# Patient Record
Sex: Male | Born: 1980
Health system: Southern US, Community
[De-identification: ages and names within clinical notes are randomized; demographics above are authoritative.]

---

## 2017-01-29 ENCOUNTER — Emergency Department (HOSPITAL_COMMUNITY)
Admission: EM | Admit: 2017-01-29 | Discharge: 2017-01-29 | Disposition: A | Discharge status: Home / Self Care | Payer: Managed Care, Other (non HMO) | Attending: Emergency Medicine | Admitting: Emergency Medicine

## 2017-01-29 DIAGNOSIS — R45851 Suicidal ideations: Secondary | ICD-10-CM | POA: Diagnosis not present

## 2017-01-29 DIAGNOSIS — Z79899 Other long term (current) drug therapy: Secondary | ICD-10-CM | POA: Insufficient documentation

## 2017-01-29 DIAGNOSIS — F4325 Adjustment disorder with mixed disturbance of emotions and conduct: Secondary | ICD-10-CM | POA: Diagnosis not present

## 2017-01-29 LAB — CBC WITH DIFFERENTIAL/PLATELET
BASOS ABS: 0 10*3/uL (ref 0.0–0.1)
Basophils Relative: 1 %
EOS PCT: 2 %
Eosinophils Absolute: 0.1 10*3/uL (ref 0.0–0.7)
HEMATOCRIT: 41.3 % (ref 39.0–52.0)
HEMOGLOBIN: 14.5 g/dL (ref 13.0–17.0)
LYMPHS ABS: 2 10*3/uL (ref 0.7–4.0)
LYMPHS PCT: 31 %
MCH: 32 pg (ref 26.0–34.0)
MCHC: 35.1 g/dL (ref 30.0–36.0)
MCV: 91.2 fL (ref 78.0–100.0)
Monocytes Absolute: 0.5 10*3/uL (ref 0.1–1.0)
Monocytes Relative: 8 %
NEUTROS ABS: 3.8 10*3/uL (ref 1.7–7.7)
NEUTROS PCT: 58 %
Platelets: 245 10*3/uL (ref 150–400)
RBC: 4.53 MIL/uL (ref 4.22–5.81)
RDW: 12.7 % (ref 11.5–15.5)
WBC: 6.5 10*3/uL (ref 4.0–10.5)

## 2017-01-29 LAB — COMPREHENSIVE METABOLIC PANEL
ALK PHOS: 53 U/L (ref 38–126)
ALT: 32 U/L (ref 17–63)
AST: 30 U/L (ref 15–41)
Albumin: 4.2 g/dL (ref 3.5–5.0)
Anion gap: 7 (ref 5–15)
BILIRUBIN TOTAL: 0.2 mg/dL — AB (ref 0.3–1.2)
BUN: 9 mg/dL (ref 6–20)
CALCIUM: 8.8 mg/dL — AB (ref 8.9–10.3)
CO2: 28 mmol/L (ref 22–32)
Chloride: 105 mmol/L (ref 101–111)
Creatinine, Ser: 1.05 mg/dL (ref 0.61–1.24)
GFR calc Af Amer: >60 mL/min (ref >60)
Glucose, Bld: 91 mg/dL (ref 65–99)
Potassium: 3.6 mmol/L (ref 3.5–5.1)
SODIUM: 140 mmol/L (ref 135–145)
TOTAL PROTEIN: 7 g/dL (ref 6.5–8.1)

## 2017-01-29 LAB — RAPID URINE DRUG SCREEN, HOSP PERFORMED
Amphetamines: NOT DETECTED
BARBITURATES: NOT DETECTED
BENZODIAZEPINES: NOT DETECTED
Cocaine: NOT DETECTED
Opiates: NOT DETECTED
Tetrahydrocannabinol: NOT DETECTED

## 2017-01-29 LAB — ETHANOL: ALCOHOL ETHYL (B): 127 mg/dL — AB (ref <5)

## 2017-01-29 MED ORDER — ONDANSETRON HCL 4 MG PO TABS: 4.0000 mg | ORAL_TABLET | Freq: Three times a day (TID) | ORAL | Status: DC | PRN

## 2017-01-29 MED ORDER — IBUPROFEN 200 MG PO TABS: 600.0000 mg | ORAL_TABLET | Freq: Three times a day (TID) | ORAL | Status: DC | PRN

## 2017-01-29 MED ORDER — ALUM & MAG HYDROXIDE-SIMETH 200-200-20 MG/5ML PO SUSP: 30.0000 mL | Freq: Four times a day (QID) | ORAL | Status: DC | PRN

## 2017-01-29 NOTE — ED Triage Notes (Signed)
Pt IVC by wife after pt made statements of si

## 2017-01-29 NOTE — ED Notes (Signed)
Pt. Transferred to [facility] from ED to room 37 after screening for contraband. Report to include Situation, Background, Assessment and Recommendations from [facility]. Pt. Oriented to unit including Q15 minute rounds as well as the security cameras for their protection. Patient is alert and oriented, warm and dry in no acute distress. Patient denies SI, HI, and AVH. Pt. Encouraged to let me know if needs arise.

## 2017-01-29 NOTE — Progress Notes (Signed)
TTS went to assess pt. Pt currently being transferred from [facility] to [facility]. Assessment to be completed once transfer is complete.   Princess Bruins, MSW, LCSWA TTS Specialist [phone]

## 2017-01-29 NOTE — ED Notes (Signed)
IVC has been rescinded per DNP

## 2017-01-29 NOTE — ED Notes (Addendum)
Written dc instructions reviewed with pt.  Pt denies si/hi/avh on dc.  Pt encouraged to seek treatment if suicidal/homicidal thoughts/urges reoccur.  Pt verbalized understanding.  Pt ambulatory with mHt to dc ara, belongings returned after leaving the area.

## 2017-01-29 NOTE — ED Notes (Signed)
Up to the bathroom 

## 2017-01-29 NOTE — ED Notes (Signed)
Hourly rounding reveals patient in room talking to TTS. No complaints, stable, in no acute distress. Q15 minute rounds and monitoring via Security Cameras to continue. 

## 2017-01-29 NOTE — [facility] Suicide Risk Assessment (Signed)
Suicide Risk Assessment  Discharge Assessment   [facility] Discharge Suicide Risk Assessment   Principal Problem: Adjustment disorder with mixed disturbance of emotions and conduct Discharge Diagnoses:  Patient Active Problem List   Diagnosis Date Noted  . Adjustment disorder with mixed disturbance of emotions and conduct [F43.25] 01/29/2017    Priority: High    Total Time spent with patient: 45 minutes  Musculoskeletal: Strength & Muscle Tone: within normal limits Gait & Station: normal Patient leans: N/A  Psychiatric Specialty Exam:   Blood pressure (!) 144/89, pulse (!) 104, temperature 98.4 F (36.9 C), temperature source Oral, resp. rate 18, SpO2 96 %.There is no height or weight on file to calculate BMI.  General Appearance: Casual  Eye Contact::  Good  Speech:  Normal Rate409  Volume:  Normal  Mood:  Euthymic  Affect:  Congruent  Thought Process:  Coherent and Descriptions of Associations: Intact  Orientation:  Full (Time, Place, and Person)  Thought Content:  WDL and Logical  Suicidal Thoughts:  No  Homicidal Thoughts:  No  Memory:  Immediate;   Good Recent;   Good Remote;   Good  Judgement:  Fair  Insight:  Fair  Psychomotor Activity:  Normal  Concentration:  Good  Recall:  Good  Fund of Knowledge:Good  Language: Good  Akathisia:  No  Handed:  Right  AIMS (if indicated):     Assets:  Housing Intimacy Leisure Time Physical Health Resilience Social Support  Sleep:     Cognition: WNL  ADL's:  Intact   Mental Status Per Nursing Assessment::   On Admission:   36 yo male who came to the ED after drinking and arguing with his wife.  Not suicidal/homicidal ideations, hallucinations, and withdrawal symptoms.  Lives with his wife and she has removed the guns from the home, called by TTS.  Stable for discharge.    Demographic Factors:  Male and Caucasian  Loss Factors: NA  Historical Factors: NA  Risk Reduction Factors:   Sense of responsibility to  family, Living with another person, especially a relative and Positive social support  Continued Clinical Symptoms:  None   Cognitive Features That Contribute To Risk:  None    Suicide Risk:  Minimal: No identifiable suicidal ideation.  Patients presenting with no risk factors but with morbid ruminations; may be classified as minimal risk based on the severity of the depressive symptoms    Plan Of Care/Follow-up recommendations:  Activity:  as tolerated Diet:  heart healthy diet  Keith Felten, NP 01/29/2017, 12:29 PM

## 2017-01-29 NOTE — ED Notes (Signed)
Registration in w/pt. 

## 2017-01-29 NOTE — ED Notes (Signed)
Pt changed to paper scrubbs and wanded by security

## 2017-01-29 NOTE — ED Provider Notes (Signed)
[facility]EMER[facility]ENC[facility] DEPT Provider Note   CSN: [id number] Arrival date & time: 01/29/17  0214     History   Chief Complaint Chief Complaint  Patient presents with  . Medical Clearance    HPI Ricky Grant is a 36 y.o. male.  The history is provided by the patient, the police and medical records.  Mental Health Problem  Presenting symptoms: suicidal thoughts and suicidal threats   Presenting symptoms: no hallucinations   Patient accompanied by:  Law enforcement Degree of incapacity (severity):  Severe Onset quality:  Unable to specify Timing:  Constant Progression:  Unchanged Chronicity:  New Context: alcohol use   Treatment compliance:  Most of the time Relieved by:  Nothing Worsened by:  Nothing Ineffective treatments:  None tried Associated symptoms: no feelings of worthlessness   Risk factors: no family hx of mental illness     No past medical history on file.  There are no active problems to display for this patient.   No past surgical history on file.     Home Medications    Prior to Admission medications   Medication Sig Start Date End Date Taking? Authorizing Provider  busPIRone (BUSPAR) 15 MG tablet Take 7.5 mg by mouth daily.   Yes [provider]    Family History No family history on file.  Social History Social History  Substance Use Topics  . Smoking status: Not on file  . Smokeless tobacco: Not on file  . Alcohol use Not on file     Allergies   Patient has no known allergies.   Review of Systems Review of Systems  Gastrointestinal: Negative for diarrhea, nausea, rectal pain and vomiting.  Psychiatric/Behavioral: Positive for suicidal ideas. Negative for hallucinations.  All other systems reviewed and are negative.    Physical Exam Updated Vital Signs BP 117/85 (BP Location: Left Arm)   Pulse (!) 106   Temp 98.4 F (36.9 C) (Oral)   Resp 18   SpO2 98%   Physical Exam  Constitutional: He is oriented to person,  place, and time. He appears well-developed and well-nourished. No distress.  HENT:  Head: Normocephalic and atraumatic.  Nose: Nose normal.  Mouth/Throat: No oropharyngeal exudate.  Eyes: Pupils are equal, round, and reactive to light. Conjunctivae are normal.  Neck: Normal range of motion. Neck supple.  Cardiovascular: Normal rate, regular rhythm, normal heart sounds and intact distal pulses.   Pulmonary/Chest: Effort normal and breath sounds normal. He has no wheezes. He has no rales.  Abdominal: Soft. Bowel sounds are normal. He exhibits no mass. There is no tenderness. There is no rebound and no guarding.  Musculoskeletal: Normal range of motion.  Neurological: He is alert and oriented to person, place, and time.  Skin: Skin is warm and dry. Capillary refill takes less than 2 seconds.  Psychiatric: Thought content is not paranoid.     ED Treatments / Results   Vitals:   01/29/17 0231 01/29/17 0528  BP: 117/85 (!) 144/89  Pulse: (!) 106 (!) 104  Resp: 18 18  Temp: 98.4 F (36.9 C)   SpO2: 98% 96%    Labs (all labs ordered are listed, but only abnormal results are displayed) Results for orders placed or performed during the hospital encounter of 01/29/17  Comprehensive metabolic panel  Result Value Ref Range   Sodium 140 135 - 145 mmol/L   Potassium 3.6 3.5 - 5.1 mmol/L   Chloride 105 101 - 111 mmol/L   CO2 28 22 - 32 mmol/L  Glucose, Bld 91 65 - 99 mg/dL   BUN 9 6 - 20 mg/dL   Creatinine, Ser 2.87 0.61 - 1.24 mg/dL   Calcium 8.8 (L) 8.9 - 10.3 mg/dL   Total Protein 7.0 6.5 - 8.1 g/dL   Albumin 4.2 3.5 - 5.0 g/dL   AST 30 15 - 41 U/L   ALT 32 17 - 63 U/L   Alkaline Phosphatase 53 38 - 126 U/L   Total Bilirubin 0.2 (L) 0.3 - 1.2 mg/dL   GFR calc non Af Amer >60 >60 mL/min   GFR calc Af Amer >60 >60 mL/min   Anion gap 7 5 - 15  Ethanol  Result Value Ref Range   Alcohol, Ethyl (B) 127 (H) <5 mg/dL  Urine rapid drug screen (hosp performed)  Result Value Ref Range    Opiates NONE DETECTED NONE DETECTED   Cocaine NONE DETECTED NONE DETECTED   Benzodiazepines NONE DETECTED NONE DETECTED   Amphetamines NONE DETECTED NONE DETECTED   Tetrahydrocannabinol NONE DETECTED NONE DETECTED   Barbiturates NONE DETECTED NONE DETECTED  CBC with Diff  Result Value Ref Range   WBC 6.5 4.0 - 10.5 K/uL   RBC 4.53 4.22 - 5.81 MIL/uL   Hemoglobin 14.5 13.0 - 17.0 g/dL   HCT 86.7 67.2 - 09.4 %   MCV 91.2 78.0 - 100.0 fL   MCH 32.0 26.0 - 34.0 pg   MCHC 35.1 30.0 - 36.0 g/dL   RDW 70.9 62.8 - 36.6 %   Platelets 245 150 - 400 K/uL   Neutrophils Relative % 58 %   Neutro Abs 3.8 1.7 - 7.7 K/uL   Lymphocytes Relative 31 %   Lymphs Abs 2.0 0.7 - 4.0 K/uL   Monocytes Relative 8 %   Monocytes Absolute 0.5 0.1 - 1.0 K/uL   Eosinophils Relative 2 %   Eosinophils Absolute 0.1 0.0 - 0.7 K/uL   Basophils Relative 1 %   Basophils Absolute 0.0 0.0 - 0.1 K/uL   No results found.   Radiology No results found.  Procedures Procedures (including critical care time)  Medications Ordered in ED  Medications  ondansetron (ZOFRAN) tablet 4 mg (not administered)  alum & mag hydroxide-simeth (MAALOX/MYLANTA) 200-200-20 MG/5ML suspension 30 mL (not administered)  ibuprofen (ADVIL,MOTRIN) tablet 600 mg (not administered)       Final Clinical Impressions(s) / ED Diagnoses  Under IVC by family for making suicidal statements involving a gun.  He is medically cleared by be to be seen by psychiatry.       Milaina Sher, MD 01/29/17 [phone]

## 2017-01-29 NOTE — BH Assessment (Signed)
Tele Assessment Note   Patient Name: Ricky Grant MRN: [id number] Referring Physician: Cy Blamer, MD Location of Patient: [facility] Location of Provider: Behavioral Health TTS Department  Ricky Grant is an 36 y.o. male who presents to the ED under IVC initiated by the responding officer. IVC states the pt has a hx of Bipolar D/O and takes medication for his diagnosis. Pt denies this during the assessment. IVC reports the pt consumed a large amount of alcohol and has been drinking heavily. Pt reportedly told his wife his wanted to kill himself and LEO witnessed the pt attempted to access the guns out of his safe and also attempted to flee officers.  Pt provided verbal consent to contact the responding officer who initiated the IVC in order to obtain collateral information. TTS attempted to contact the responding officer at the number identified on the IVC [phone] but the number stated it is not a working number.   During the assessment, the pt reports that he got into an argument with his wife this evening and "said some things he should not have said." Pt reports that he is not suicidal and denies that he has ever wanted to harm himself or anyone else. Pt states he has 2 children at home that he loves and he has a lot to live for. Pt reports he has been dealing with stress at work and wanted to drink on Friday night. Pt reports he told his wife he should "just end it all" during the argument which led her to calling the police. Pt denies any previous inpt hospitalizations and he appeared anxious as this Clinical research associate explained inpt treatment to the pt. Pt stated "no I don't want that. I have to go to work. Can I leave? Am I legally allowed to leave the hospital? I don't want to be in a hospital. I don't want to kill myself. I would never do that."  Pt denies HI and denies AVH.   Case discussed with Nira Conn, NP who recommends inpt treatment. Pt's RN Jillyn Hidden notified of the recommendation. TTS  to seek placement.   Diagnosis: MDD, single episode, w/o psychosis; Alcohol Use D/O  Past Medical History: No past medical history on file.  No past surgical history on file.  Family History: No family history on file.  Social History:  has no tobacco, alcohol, and drug history on file.  Additional Social History:  Alcohol / Drug Use Pain Medications: See MAR Prescriptions: See MAR Over the Counter: See MAR History of alcohol / drug use?: Yes Substance #1 Name of Substance 1: Alcohol 1 - Age of First Use: teens 1 - Amount (size/oz): 5 beers 1 - Frequency: 2x/week 1 - Duration: ongoing 1 - Last Use / Amount: 01/29/17  CIWA: CIWA-Ar BP: (!) 144/89 Pulse Rate: (!) 104 COWS:    PATIENT STRENGTHS: (choose at least two) Average or above average intelligence Capable of independent living Communication skills General fund of knowledge Physical Health Work skills  Allergies: No Known Allergies  Home Medications:  (Not in a hospital admission)  OB/GYN Status:  No LMP for male patient.  General Assessment Data Location of Assessment: [facility] TTS Assessment: In system Is this a Tele or Face-to-Face Assessment?: Face-to-Face Is this an Initial Assessment or a Re-assessment for this encounter?: Initial Assessment Marital status: Married Is patient pregnant?: No Pregnancy Status: No Living Arrangements: Spouse/significant other, Children Can pt return to current living arrangement?: Yes Admission Status: Involuntary Is patient capable of signing voluntary admission?:  No Referral Source: Self/Family/Friend Insurance type: none     Crisis Care Plan Living Arrangements: Spouse/significant other, Children Name of Psychiatrist: none Name of Therapist: none  Education Status Is patient currently in school?: No Highest grade of school patient has completed: Bachelor's Degree  Risk to self with the past 6 months Suicidal Ideation: Yes-Currently Present (per IVC, pt  made threats to shoot himself. pt denies) Has patient been a risk to self within the past 6 months prior to admission? : Yes (per IVC, attempted to flee from officers) Suicidal Intent: Yes-Currently Present (per IVC) Has patient had any suicidal intent within the past 6 months prior to admission? : Yes Is patient at risk for suicide?: Yes Suicidal Plan?: Yes-Currently Present Has patient had any suicidal plan within the past 6 months prior to admission? : Yes Specify Current Suicidal Plan: per IVC, pt told officers he wanted to shoot himself with his guns  Access to Means: Yes Specify Access to Suicidal Means: pt reports he owns "a couple of guns" What has been your use of drugs/alcohol within the last 12 months?: reports to drinking heavy amounts of alcohol PTA Previous Attempts/Gestures: No Triggers for Past Attempts: None known Intentional Self Injurious Behavior: None Family Suicide History: No Recent stressful life event(s): Conflict (Comment), Other (Comment) (w/ wife, work stressors ) Persecutory voices/beliefs?: No Depression: Yes Depression Symptoms: Feeling angry/irritable, Guilt Substance abuse history and/or treatment for substance abuse?: Yes Suicide prevention information given to non-admitted patients: Not applicable  Risk to Others within the past 6 months Homicidal Ideation: No Does patient have any lifetime risk of violence toward others beyond the six months prior to admission? : No Thoughts of Harm to Others: No Current Homicidal Intent: No Current Homicidal Plan: No Access to Homicidal Means: No History of harm to others?: No Assessment of Violence: None Noted Does patient have access to weapons?: Yes (Comment) (pt reports he owns multiple guns ) Criminal Charges Pending?: No Does patient have a court date: No Is patient on probation?: No  Psychosis Hallucinations: None noted Delusions: None noted  Mental Status Report Appearance/Hygiene: In scrubs,  Unremarkable Eye Contact: Good Motor Activity: Freedom of movement Speech: Logical/coherent Level of Consciousness: Alert Mood: Pleasant, Anxious, Ashamed/humiliated, Depressed Affect: Anxious Anxiety Level: Moderate Thought Processes: Relevant, Coherent Judgement: Impaired Orientation: Person, Place, Time, Situation, Appropriate for developmental age Obsessive Compulsive Thoughts/Behaviors: None  Cognitive Functioning Concentration: Normal Memory: Remote Intact, Recent Intact IQ: Average Insight: Poor Impulse Control: Poor Appetite: Good Sleep: No Change Total Hours of Sleep: 8 Vegetative Symptoms: None  ADLScreening [facility] Assessment Services) Patient's cognitive ability adequate to safely complete daily activities?: Yes Patient able to express need for assistance with ADLs?: Yes Independently performs ADLs?: Yes (appropriate for developmental age)  Prior Inpatient Therapy Prior Inpatient Therapy: No  Prior Outpatient Therapy Prior Outpatient Therapy: No Does patient have an ACCT team?: No Does patient have Intensive In-House Services?  : No Does patient have [facility] services? : No Does patient have P4CC services?: No  ADL Screening (condition at time of admission) Patient's cognitive ability adequate to safely complete daily activities?: Yes Is the patient deaf or have difficulty hearing?: No Does the patient have difficulty seeing, even when wearing glasses/contacts?: No Does the patient have difficulty concentrating, remembering, or making decisions?: No Patient able to express need for assistance with ADLs?: Yes Does the patient have difficulty dressing or bathing?: No Independently performs ADLs?: Yes (appropriate for developmental age) Does the patient have difficulty walking or climbing  stairs?: No Weakness of Legs: None Weakness of Arms/Hands: None  Home Assistive Devices/Equipment Home Assistive Devices/Equipment: None    Abuse/Neglect Assessment  (Assessment to be complete while patient is alone) Physical Abuse: Denies Verbal Abuse: Denies Sexual Abuse: Denies Exploitation of patient/patient's resources: Denies Self-Neglect: Denies     Merchant navy officer (For Healthcare) Does Patient Have a Medical Advance Directive?: No Would patient like information on creating a medical advance directive?: No - Patient declined    Additional Information 1:1 In Past 12 Months?: No CIRT Risk: No Elopement Risk: No Does patient have medical clearance?: Yes     Disposition:  Disposition Initial Assessment Completed for this Encounter: Yes Disposition of Patient: Inpatient treatment program Type of inpatient treatment program: Adult (per Nira Conn, NP)    Karolee Ohs 01/29/2017 6:20 AM

## 2020-04-02 ENCOUNTER — Other Ambulatory Visit: Payer: Self-pay

## 2020-04-02 ENCOUNTER — Ambulatory Visit (INDEPENDENT_AMBULATORY_CARE_PROVIDER_SITE_OTHER): Payer: Managed Care, Other (non HMO)

## 2020-04-02 ENCOUNTER — Encounter (HOSPITAL_COMMUNITY): Payer: Self-pay

## 2020-04-02 ENCOUNTER — Ambulatory Visit (HOSPITAL_COMMUNITY)
Admission: EM | Admit: 2020-04-02 | Discharge: 2020-04-02 | Disposition: A | Discharge status: Home / Self Care | Payer: Managed Care, Other (non HMO) | Attending: Family Medicine | Admitting: Family Medicine

## 2020-04-02 DIAGNOSIS — R109 Unspecified abdominal pain: Secondary | ICD-10-CM | POA: Diagnosis not present

## 2020-04-02 DIAGNOSIS — R1033 Periumbilical pain: Secondary | ICD-10-CM | POA: Diagnosis not present

## 2020-04-02 NOTE — ED Triage Notes (Signed)
Patient in with  C/o generalized sharp abdominal pain that started today. States the pain was so bad he felt like he had to vomit.   States that he was dry heaving as well  Has not had meds for pain  Denies diarrhea or vomiting

## 2020-04-02 NOTE — Discharge Instructions (Addendum)
There was a moderate amount of stool on your xray.  I would recommend trying a laxative and gas x over the counter.  If your symptoms worsen over the next few days please go to the ER.

## 2020-04-03 NOTE — ED Provider Notes (Signed)
[facility]    CSN: [id number] Arrival date & time: 04/02/20  1304      History   Chief Complaint Chief Complaint  Patient presents with  . Abdominal Pain    HPI Ricky Grant is a 39 y.o. male.   Patient is a 39 year old male with no sniffing past medical history.  He presents today with abdominal pain that started today.  Describes as sharp and centered in the epigastric, periumbilical area.  Sometimes radiates down to the right lower quadrant.  Reporting the pain was so bad it caused him to feel nauseous and dry heaves.  No specific vomiting.  He has had some loose stools but denies any blood in stool or specific history of constipation.  Has not take anything for her symptoms.  Did eat today.  No fevers, chills     History reviewed. No pertinent past medical history.  Patient Active Problem List   Diagnosis Date Noted  . Adjustment disorder with mixed disturbance of emotions and conduct 01/29/2017    History reviewed. No pertinent surgical history.     Home Medications    Prior to Admission medications   Medication Sig Start Date End Date Taking? Authorizing Provider  busPIRone (BUSPAR) 15 MG tablet Take 7.5 mg by mouth daily.    [provider]    Family History Family History  Problem Relation Age of Onset  . Diabetes Father     Social History Social History   Tobacco Use  . Smoking status: Never Smoker  . Smokeless tobacco: Never Used  Vaping Use  . Vaping Use: Never used  Substance Use Topics  . Alcohol use: Yes  . Drug use: Never     Allergies   Patient has no known allergies.   Review of Systems Review of Systems   Physical Exam Triage Vital Signs ED Triage Vitals  Enc Vitals Group     BP 04/02/20 1341 124/76     Pulse Rate 04/02/20 1341 74     Resp 04/02/20 1341 18     Temp 04/02/20 1341 98.4 F (36.9 C)     Temp Source 04/02/20 1341 Oral     SpO2 04/02/20 1341 100 %     Weight --      Height --       Head Circumference --      Peak Flow --      Pain Score 04/02/20 1339 5     Pain Loc --      Pain Edu? --      Excl. in GC? --    No data found.  Updated Vital Signs BP 124/76 (BP Location: Left Arm)   Pulse 74   Temp 98.4 F (36.9 C) (Oral)   Resp 18   SpO2 100%   Visual Acuity Right Eye Distance:   Left Eye Distance:   Bilateral Distance:    Right Eye Near:   Left Eye Near:    Bilateral Near:     Physical Exam Vitals and nursing note reviewed.  Constitutional:      Appearance: Normal appearance.  HENT:     Head: Normocephalic and atraumatic.     Nose: Nose normal.  Eyes:     Conjunctiva/sclera: Conjunctivae normal.  Pulmonary:     Effort: Pulmonary effort is normal.  Abdominal:     General: Bowel sounds are normal.     Palpations: Abdomen is soft.     Tenderness: There is no abdominal tenderness.  Comments: No specific tenderness to palpation of entire abdomen.  Mild tenderness to deep palpation of umbilical area.  No specific no hernias palpated  Musculoskeletal:        General: Normal range of motion.     Cervical back: Normal range of motion.  Skin:    General: Skin is warm and dry.  Neurological:     Mental Status: He is alert.  Psychiatric:        Mood and Affect: Mood normal.      UC Treatments / Results  Labs (all labs ordered are listed, but only abnormal results are displayed) Labs Reviewed - No data to display  EKG   Radiology DG Abd 1 View  Result Date: 04/02/2020 CLINICAL DATA:  Generalized sharp abdominal pain today. Clinical concern of constipation. EXAM: ABDOMEN - 1 VIEW COMPARISON:  None. FINDINGS: The visualized bowel gas pattern is normal. There is no supine evidence of free intraperitoneal air or bowel wall thickening. Colonic stool burden does not appear significantly increased. Small right pelvic calcifications are nonspecific, although likely phleboliths. No osseous abnormalities are identified. IMPRESSION: No acute  abdominal findings. Colonic stool burden within physiologic limits. Electronically Signed   By: Carey Bullocks M.D.   On: 04/02/2020 14:31    Procedures Procedures (including critical care time)  Medications Ordered in UC Medications - No data to display  Initial Impression / Assessment and Plan / UC Course  I have reviewed the triage vital signs and the nursing notes.  Pertinent labs & imaging results that were available during my care of the patient were reviewed by me and considered in my medical decision making (see chart for details).     Periumbilical abdominal pain No acute abdomen on exam.  Differentials include hernia versus appendicitis versus constipation versus gas. X-ray with nothing acute but did show moderate amount of stool backup. No concern for appendicitis at this time.  Vital signs stable. Recommended MiraLAX and Gas-X to see if this helps his symptoms.  Drink plenty of fluids. For any worsening abdominal pain he will need to go to the ER for CT scan. Patient understanding and agreed to plan. Final Clinical Impressions(s) / UC Diagnoses   Final diagnoses:  Periumbilical abdominal pain     Discharge Instructions     There was a moderate amount of stool on your xray.  I would recommend trying a laxative and gas x over the counter.  If your symptoms worsen over the next few days please go to the ER.     ED Prescriptions    None     PDMP not reviewed this encounter.   Dahlia Byes A, NP 04/03/20 1022

## 2020-04-21 ENCOUNTER — Other Ambulatory Visit: Payer: Self-pay

## 2020-04-21 ENCOUNTER — Ambulatory Visit (HOSPITAL_COMMUNITY)
Admission: EM | Admit: 2020-04-21 | Discharge: 2020-04-21 | Disposition: A | Discharge status: Home / Self Care | Payer: Managed Care, Other (non HMO) | Attending: Family Medicine | Admitting: Family Medicine

## 2020-04-21 ENCOUNTER — Encounter (HOSPITAL_COMMUNITY): Payer: Self-pay | Admitting: Emergency Medicine

## 2020-04-21 DIAGNOSIS — M79605 Pain in left leg: Secondary | ICD-10-CM

## 2020-04-21 DIAGNOSIS — T148XXA Other injury of unspecified body region, initial encounter: Secondary | ICD-10-CM | POA: Diagnosis not present

## 2020-04-21 MED ORDER — CYCLOBENZAPRINE HCL 10 MG PO TABS: 10.0000 mg | ORAL_TABLET | Freq: Three times a day (TID) | ORAL | 0 refills | Status: AC | PRN

## 2020-04-21 MED ORDER — NAPROXEN 500 MG PO TABS: 500.0000 mg | ORAL_TABLET | Freq: Two times a day (BID) | ORAL | 0 refills | Status: AC

## 2020-04-21 NOTE — ED Triage Notes (Signed)
Pt c/o left lower leg pain onset yesterday. He states he turned and felt a pop and it has been swollen and warm to the touch. He is having pain with ambulating.

## 2020-04-21 NOTE — ED Provider Notes (Signed)
[facility]    CSN: [id number] Arrival date & time: 04/21/20  1350      History   Chief Complaint Chief Complaint  Patient presents with   Leg Pain    HPI Ricky Grant is a 39 y.o. male.   Patient presenting today with 1 day hx of right medial calf pain, swelling after he took a step wrong yesterday morning. States he immediately heard a pop sound and has had significant pain since. Denies bruising or discoloration, fever, chills, hx of orthopedic issues. Has been taking tylenol without any relief so far.      History reviewed. No pertinent past medical history.  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 No pertinent surgical history.     Home Medications    Prior to Admission medications   Medication Sig Start Date End Date Taking? Authorizing Provider  busPIRone (BUSPAR) 15 MG tablet Take 7.5 mg by mouth daily.    [provider]  cyclobenzaprine (FLEXERIL) 10 MG tablet Take 1 tablet (10 mg total) by mouth 3 (three) times daily as needed for muscle spasms. DO NOT DRINK ALCOHOL OR DRIVE WHILE TAKING THIS MEDICATION 04/21/20   Particia Nearing, PA-C  naproxen (NAPROSYN) 500 MG tablet Take 1 tablet (500 mg total) by mouth 2 (two) times daily. 04/21/20   Particia Nearing, PA-C    Family History Family History  Problem Relation Age of Onset   Diabetes Father     Social History Social History   Tobacco Use   Smoking status: Never Smoker   Smokeless tobacco: Never Used  Building services engineer Use: Never used  Substance Use Topics   Alcohol use: Yes   Drug use: Never     Allergies   Patient has no known allergies.   Review of Systems Review of Systems PER HPI    Physical Exam Triage Vital Signs ED Triage Vitals  Enc Vitals Group     BP 04/21/20 1443 137/84     Pulse Rate 04/21/20 1443 90      Resp --      Temp 04/21/20 1443 98.2 F (36.8 C)     Temp Source 04/21/20 1443 Oral     SpO2 04/21/20 1443 100 %     Weight --      Height --      Head Circumference --      Peak Flow --      Pain Score 04/21/20 1441 6     Pain Loc --      Pain Edu? --      Excl. in GC? --    No data found.  Updated Vital Signs BP 137/84 (BP Location: Right Arm)    Pulse 90    Temp 98.2 F (36.8 C) (Oral)    SpO2 100%   Visual Acuity Right Eye Distance:   Left Eye Distance:   Bilateral Distance:    Right Eye Near:   Left Eye Near:    Bilateral Near:     Physical Exam Vitals and nursing note reviewed.  Constitutional:      Appearance: Normal appearance.  HENT:     Head: Atraumatic.  Eyes:     Extraocular Movements: Extraocular movements intact.     Conjunctiva/sclera: Conjunctivae normal.  Cardiovascular:     Rate and Rhythm: Normal rate and regular rhythm.  Pulmonary:  Effort: Pulmonary effort is normal.     Breath sounds: Normal breath sounds.  Musculoskeletal:        General: Tenderness (ttp left medial calf, with edema to this area diffusely) present. Normal range of motion.     Cervical back: Normal range of motion and neck supple.  Skin:    General: Skin is warm and dry.     Findings: No erythema.  Neurological:     General: No focal deficit present.     Mental Status: He is oriented to person, place, and time.  Psychiatric:        Mood and Affect: Mood normal.        Thought Content: Thought content normal.        Judgment: Judgment normal.      UC Treatments / Results  Labs (all labs ordered are listed, but only abnormal results are displayed) Labs Reviewed - No data to display  EKG   Radiology No results found.  Procedures Procedures (including critical care time)  Medications Ordered in UC Medications - No data to display  Initial Impression / Assessment and Plan / UC Course  I have reviewed the triage vital signs and the nursing  notes.  Pertinent labs & imaging results that were available during my care of the patient were reviewed by me and considered in my medical decision making (see chart for details).     Suspect calf strain. No palpable deformity to suggest full muscle tear, no bruising or redness. Tx with flexeril, naproxen, stretches, heat. F/u if sxs worsening or not resolving.   Final Clinical Impressions(s) / UC Diagnoses   Final diagnoses:  Left leg pain  Muscle strain   Discharge Instructions   None    ED Prescriptions    Medication Sig Dispense Auth. Provider   cyclobenzaprine (FLEXERIL) 10 MG tablet Take 1 tablet (10 mg total) by mouth 3 (three) times daily as needed for muscle spasms. DO NOT DRINK ALCOHOL OR DRIVE WHILE TAKING THIS MEDICATION 15 tablet Particia Nearing, PA-C   naproxen (NAPROSYN) 500 MG tablet Take 1 tablet (500 mg total) by mouth 2 (two) times daily. 30 tablet Particia Nearing, [place]     PDMP not reviewed this encounter.   Particia Nearing, [place] 04/21/20 1728

## 2021-02-28 ENCOUNTER — Ambulatory Visit: Payer: Self-pay

## 2021-04-07 DEATH — deceased

## 2021-04-22 ENCOUNTER — Ambulatory Visit: Payer: Self-pay | Admitting: Internal Medicine

## 2022-01-15 IMAGING — DX DG ABDOMEN 1V
1 series · 1 of 1 positions shown · non-contrast
Comparison: None.

CLINICAL DATA: Generalized sharp abdominal pain today. Clinical
concern of constipation.

EXAM:
ABDOMEN - 1 VIEW

[abdomen kub]
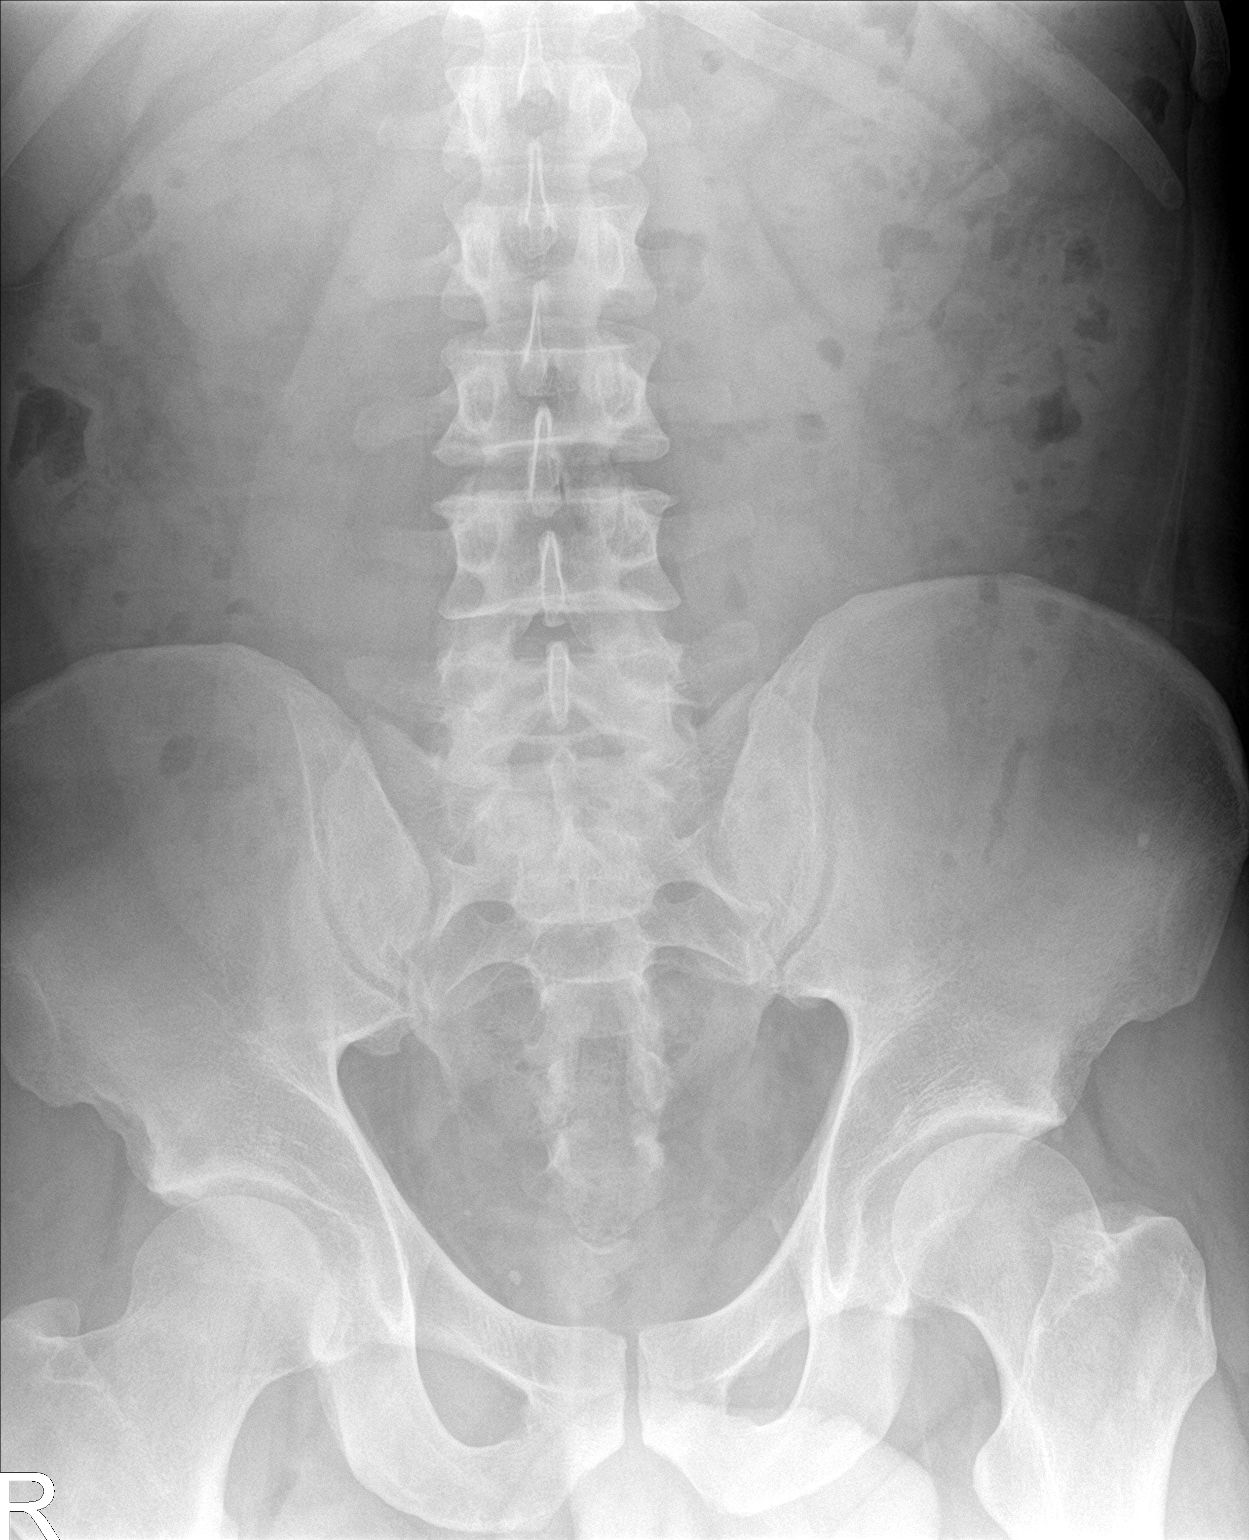

[1 of 1 positions shown; findings below may reference images not displayed]

FINDINGS: The visualized bowel gas pattern is normal. There is no supine
evidence of free intraperitoneal air or bowel wall thickening.
Colonic stool burden does not appear significantly increased. Small
right pelvic calcifications are nonspecific, although likely
phleboliths. No osseous abnormalities are identified.
IMPRESSION: No acute abdominal findings. Colonic stool burden within physiologic
limits.
